# Patient Record
Sex: Male | Born: 1990 | Race: White | Hispanic: No | Marital: Single | Smoking: Current some day smoker
Health system: Southern US, Community
[De-identification: ages and names within clinical notes are randomized; demographics above are authoritative.]

---

## 2015-04-26 ENCOUNTER — Emergency Department (HOSPITAL_COMMUNITY): Payer: Federal, State, Local not specified - PPO

## 2015-04-26 ENCOUNTER — Emergency Department (HOSPITAL_COMMUNITY)
Admission: EM | Admit: 2015-04-26 | Discharge: 2015-04-26 | Discharge status: Against Medical Advice | Payer: Federal, State, Local not specified - PPO | Attending: Emergency Medicine | Admitting: Emergency Medicine

## 2015-04-26 ENCOUNTER — Encounter (HOSPITAL_COMMUNITY): Payer: Self-pay | Admitting: *Deleted

## 2015-04-26 DIAGNOSIS — Z720 Tobacco use: Secondary | ICD-10-CM | POA: Insufficient documentation

## 2015-04-26 DIAGNOSIS — F10920 Alcohol use, unspecified with intoxication, uncomplicated: Secondary | ICD-10-CM

## 2015-04-26 DIAGNOSIS — Y929 Unspecified place or not applicable: Secondary | ICD-10-CM | POA: Diagnosis not present

## 2015-04-26 DIAGNOSIS — Y939 Activity, unspecified: Secondary | ICD-10-CM | POA: Diagnosis not present

## 2015-04-26 DIAGNOSIS — S0990XA Unspecified injury of head, initial encounter: Secondary | ICD-10-CM

## 2015-04-26 DIAGNOSIS — F10120 Alcohol abuse with intoxication, uncomplicated: Secondary | ICD-10-CM | POA: Insufficient documentation

## 2015-04-26 DIAGNOSIS — Y999 Unspecified external cause status: Secondary | ICD-10-CM | POA: Insufficient documentation

## 2015-04-26 DIAGNOSIS — S01111A Laceration without foreign body of right eyelid and periocular area, initial encounter: Secondary | ICD-10-CM | POA: Insufficient documentation

## 2015-04-26 LAB — CBC WITH DIFFERENTIAL/PLATELET
BASOS ABS: 0 10*3/uL (ref 0.0–0.1)
BASOS PCT: 0 %
Eosinophils Absolute: 0.1 10*3/uL (ref 0.0–0.7)
Eosinophils Relative: 1 %
HEMATOCRIT: 44.2 % (ref 39.0–52.0)
HEMOGLOBIN: 14.8 g/dL (ref 13.0–17.0)
Lymphocytes Relative: 52 %
Lymphs Abs: 4.3 10*3/uL — ABNORMAL HIGH (ref 0.7–4.0)
MCH: 27.6 pg (ref 26.0–34.0)
MCHC: 33.5 g/dL (ref 30.0–36.0)
MCV: 82.5 fL (ref 78.0–100.0)
MONOS PCT: 6 %
Monocytes Absolute: 0.5 10*3/uL (ref 0.1–1.0)
NEUTROS ABS: 3.4 10*3/uL (ref 1.7–7.7)
NEUTROS PCT: 41 %
Platelets: 208 10*3/uL (ref 150–400)
RBC: 5.36 MIL/uL (ref 4.22–5.81)
RDW: 15.5 % (ref 11.5–15.5)
WBC: 8.4 10*3/uL (ref 4.0–10.5)

## 2015-04-26 LAB — BASIC METABOLIC PANEL
ANION GAP: 13 (ref 5–15)
BUN: 7 mg/dL (ref 6–20)
CHLORIDE: 103 mmol/L (ref 101–111)
CO2: 23 mmol/L (ref 22–32)
Calcium: 9.2 mg/dL (ref 8.9–10.3)
Creatinine, Ser: 0.79 mg/dL (ref 0.61–1.24)
GFR calc non Af Amer: >60 mL/min (ref >60)
Glucose, Bld: 105 mg/dL — ABNORMAL HIGH (ref 65–99)
POTASSIUM: 3.6 mmol/L (ref 3.5–5.1)
SODIUM: 139 mmol/L (ref 135–145)

## 2015-04-26 LAB — ETHANOL: ALCOHOL ETHYL (B): 173 mg/dL — AB (ref <5)

## 2015-04-26 MED ORDER — LIDOCAINE-EPINEPHRINE 2 %-1:200000 IJ SOLN
20.0000 mL | Freq: Once | INTRAMUSCULAR | Status: AC
Start: 2015-04-26 — End: 2015-04-26
  Filled 2015-04-26: qty 20
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
## 2015-04-26 NOTE — ED Notes (Signed)
Patient refused to wait for lac repair.  Explained importance of lac repair.  States I'm fine and I am leaving.  Assisted to clean blood off face.  Ambulatory at this time.
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
## 2017-04-28 IMAGING — CT CT HEAD W/O CM
3 of 6 series · 14 of 47 positions shown, 16 images · non-contrast
Comparison: None.

CLINICAL DATA: Multiple facial lacerations. Concern for head or
cervical spine injury. Initial encounter.

EXAM:
CT HEAD WITHOUT CONTRAST
CT CERVICAL SPINE WITHOUT CONTRAST
TECHNIQUE: Multidetector CT imaging of the head and cervical spine was
performed following the standard protocol without intravenous
contrast. Multiplanar CT image reconstructions of the cervical spine
were also generated.

[Series 302: soft tissue, idose (2) · axial · 0.28mm/px · z∈[+7,+171]mm · 8 of 106 slices shown, 10 images]
[im 12/106  brain]
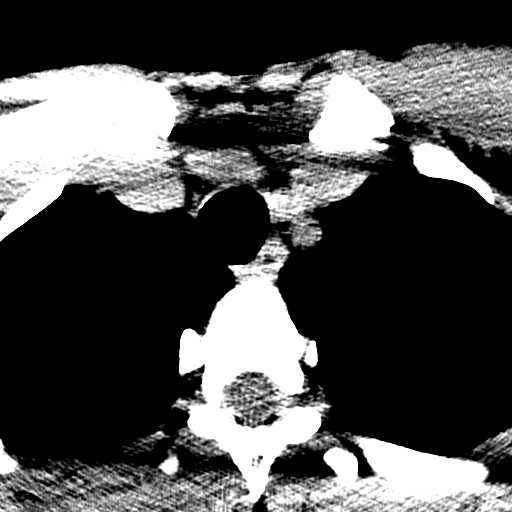
[im 12/106  bone]
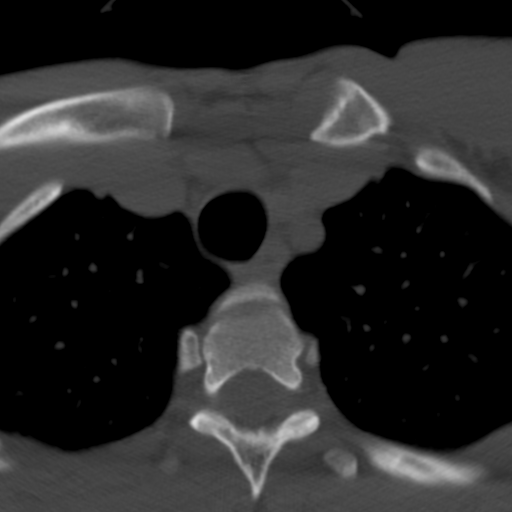
[im 24/106  brain]
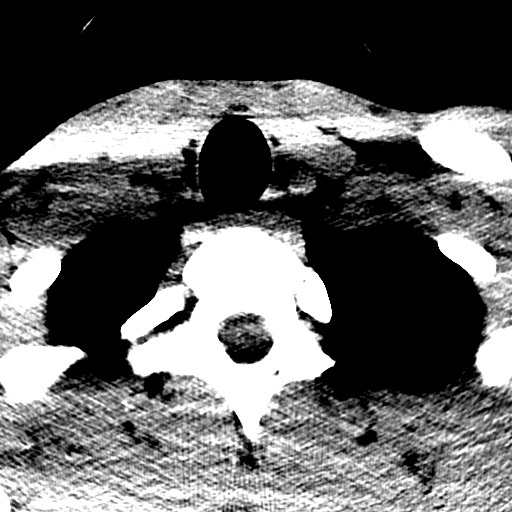
[im 36/106  brain]
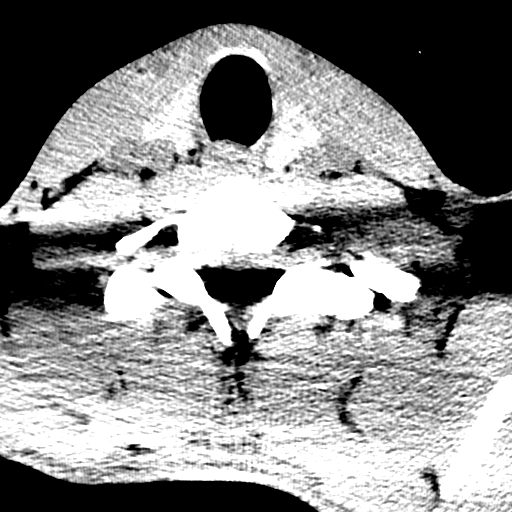
[im 47/106  brain]
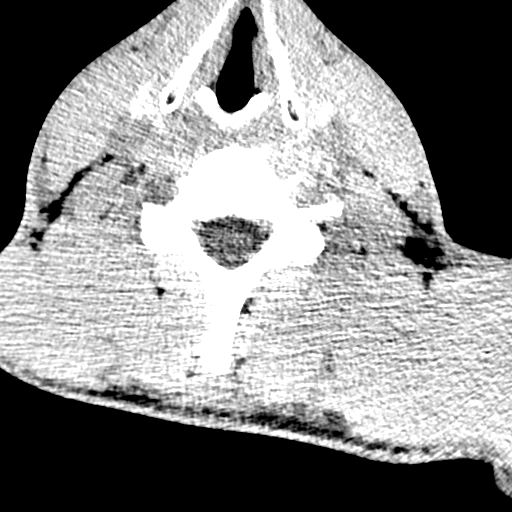
[im 59/106  brain]
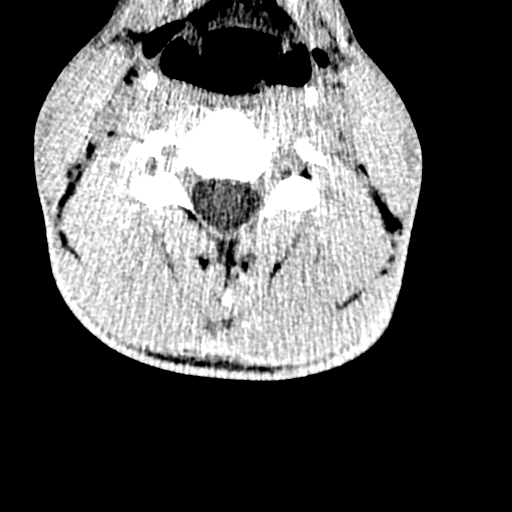
[im 59/106  bone]
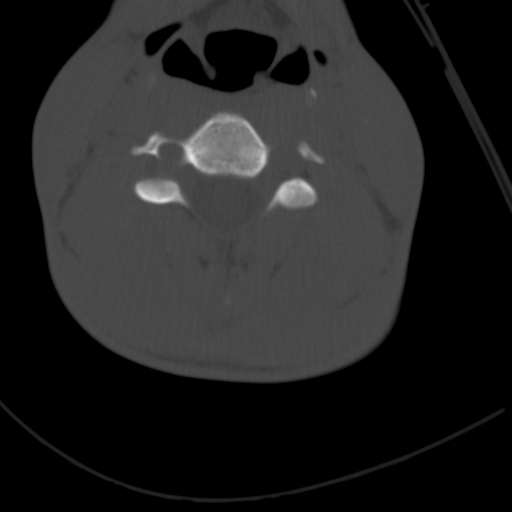
[im 71/106  brain]
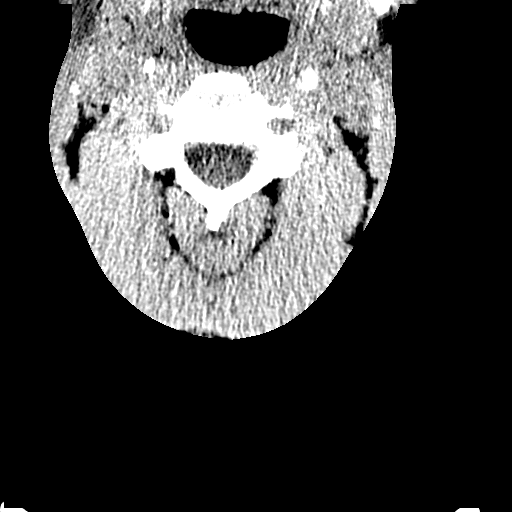
[im 82/106  brain]
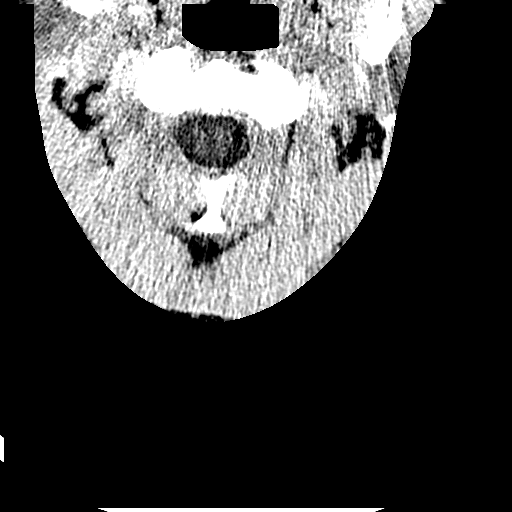
[im 94/106  brain]
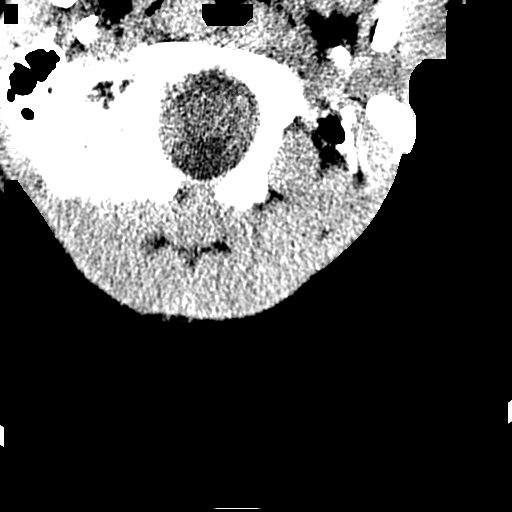

[Series 304: coronal, idose (2) · coronal · 0.28mm/px · 3 of 72 slices shown]
[im 24/72  brain]
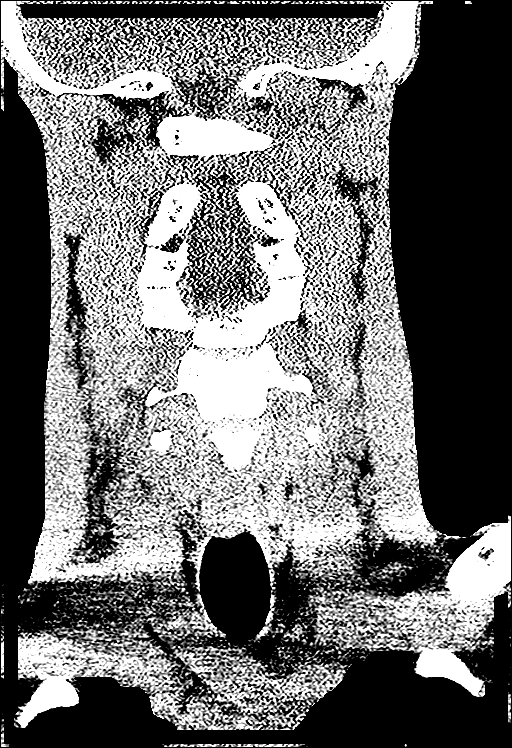
[im 32/72  brain]
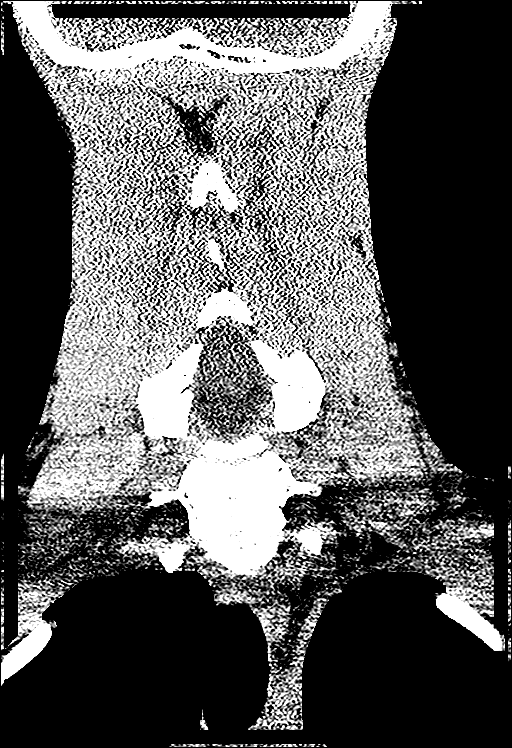
[im 40/72  brain]
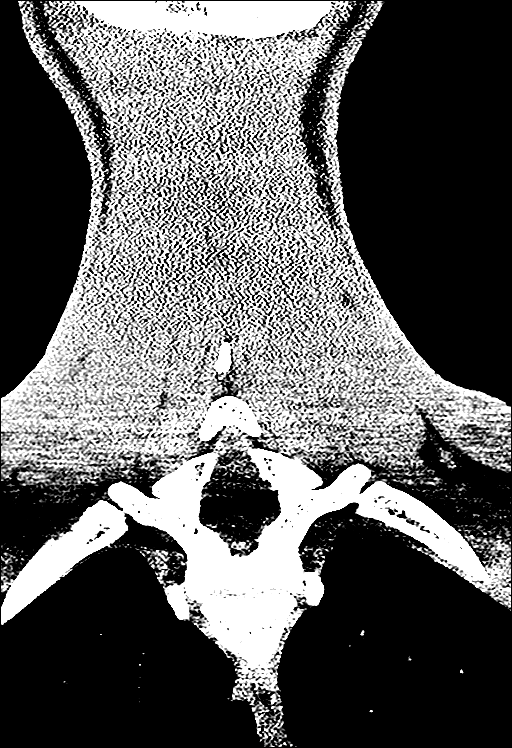

[Series 305: sagittal, idose (2) · sagittal · 0.28mm/px · 3 of 72 slices shown]
[im 24/72  brain]
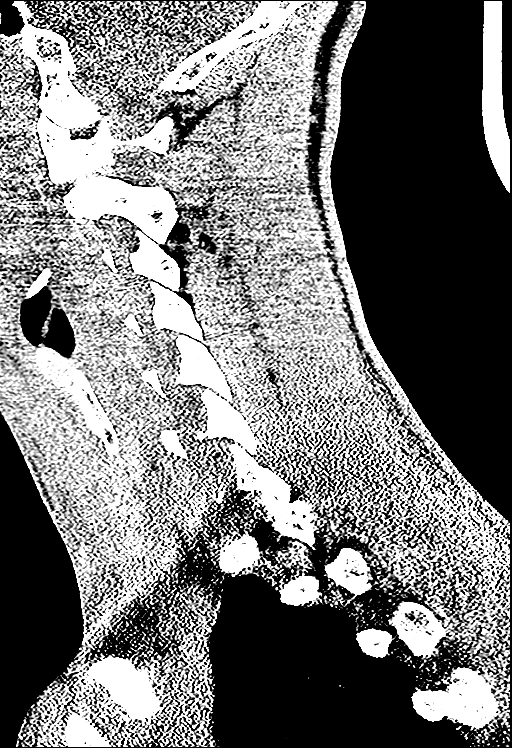
[im 36/72  brain]
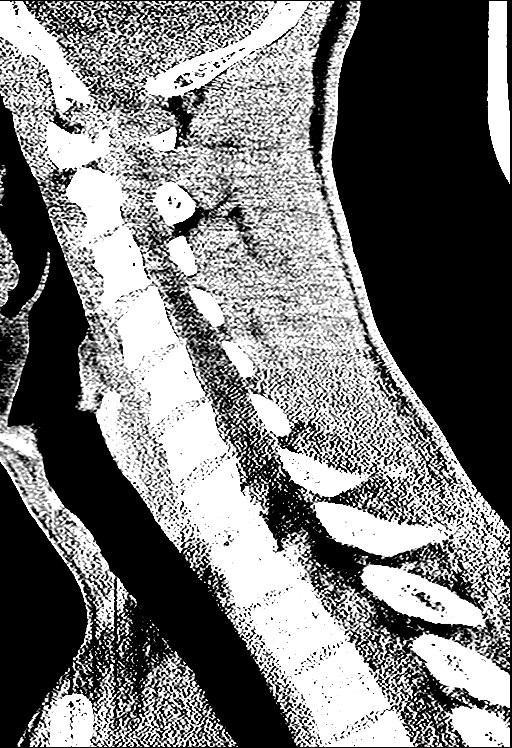
[im 48/72  brain]
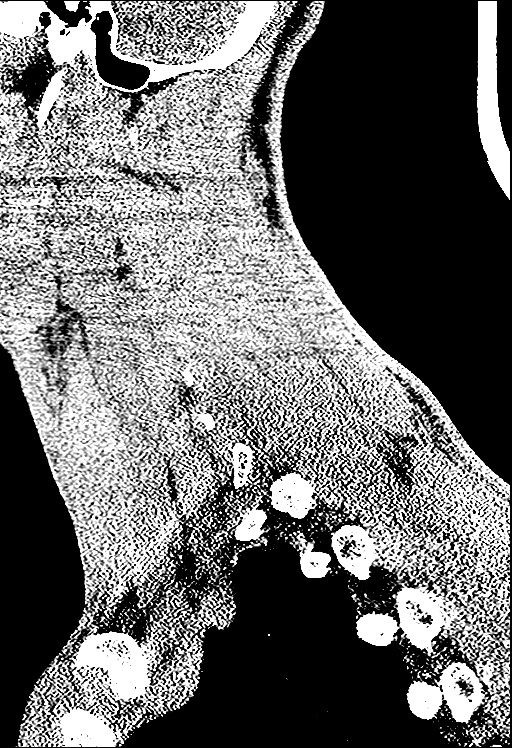

[14 of 47 positions shown; findings below may reference images not displayed]

FINDINGS: CT HEAD FINDINGS

There is no evidence of acute infarction, mass lesion, or intra- or
extra-axial hemorrhage on CT.

The posterior fossa, including the cerebellum, brainstem and fourth
ventricle, is within normal limits. The third and lateral
ventricles, and basal ganglia are unremarkable in appearance. The
cerebral hemispheres are symmetric in appearance, with normal
gray-white differentiation. No mass effect or midline shift is seen.

There is no evidence of fracture; visualized osseous structures are
unremarkable in appearance. The orbits are within normal limits. The
paranasal sinuses and mastoid air cells are well-aerated. No
significant soft tissue abnormalities are seen.

CT CERVICAL SPINE FINDINGS

There is no evidence of fracture or subluxation. Vertebral bodies
demonstrate normal height and alignment. Intervertebral disc spaces
are preserved. Prevertebral soft tissues are within normal limits.
The visualized neural foramina are grossly unremarkable.

The thyroid gland is unremarkable in appearance. The visualized lung
apices are clear. No significant soft tissue abnormalities are seen.
IMPRESSION: 1. No evidence of traumatic intracranial injury or fracture.
2. No evidence of fracture or subluxation along the cervical spine.
